# Patient Record
Sex: Male | Born: 1965 | Hispanic: Yes | Smoking: Never smoker
Health system: Southern US, Community
[De-identification: ages and names within clinical notes are randomized; demographics above are authoritative.]

---

## 2015-06-17 ENCOUNTER — Emergency Department (HOSPITAL_COMMUNITY)
Admission: EM | Admit: 2015-06-17 | Discharge: 2015-06-18 | Disposition: A | Discharge status: Home / Self Care | Payer: Self-pay | Attending: Emergency Medicine | Admitting: Emergency Medicine

## 2015-06-17 ENCOUNTER — Emergency Department (HOSPITAL_COMMUNITY): Payer: Self-pay

## 2015-06-17 ENCOUNTER — Encounter (HOSPITAL_COMMUNITY): Payer: Self-pay | Admitting: Emergency Medicine

## 2015-06-17 DIAGNOSIS — L03315 Cellulitis of perineum: Secondary | ICD-10-CM | POA: Insufficient documentation

## 2015-06-17 DIAGNOSIS — K610 Anal abscess: Secondary | ICD-10-CM | POA: Insufficient documentation

## 2015-06-17 DIAGNOSIS — N50812 Left testicular pain: Secondary | ICD-10-CM | POA: Insufficient documentation

## 2015-06-17 LAB — COMPREHENSIVE METABOLIC PANEL
ALBUMIN: 3.7 g/dL (ref 3.5–5.0)
ALK PHOS: 51 U/L (ref 38–126)
ALT: 19 U/L (ref 17–63)
ANION GAP: 9 (ref 5–15)
AST: 19 U/L (ref 15–41)
BUN: 12 mg/dL (ref 6–20)
CALCIUM: 9.4 mg/dL (ref 8.9–10.3)
CHLORIDE: 100 mmol/L — AB (ref 101–111)
CO2: 28 mmol/L (ref 22–32)
CREATININE: 0.75 mg/dL (ref 0.61–1.24)
GFR calc Af Amer: >60 mL/min (ref >60)
GFR calc non Af Amer: >60 mL/min (ref >60)
GLUCOSE: 106 mg/dL — AB (ref 65–99)
Potassium: 4.5 mmol/L (ref 3.5–5.1)
SODIUM: 137 mmol/L (ref 135–145)
Total Bilirubin: 0.7 mg/dL (ref 0.3–1.2)
Total Protein: 7.6 g/dL (ref 6.5–8.1)

## 2015-06-17 LAB — CBC WITH DIFFERENTIAL/PLATELET
BASOS PCT: 0 %
Basophils Absolute: 0 10*3/uL (ref 0.0–0.1)
EOS ABS: 0 10*3/uL (ref 0.0–0.7)
Eosinophils Relative: 0 %
HCT: 42.4 % (ref 39.0–52.0)
HEMOGLOBIN: 14.2 g/dL (ref 13.0–17.0)
Lymphocytes Relative: 8 %
Lymphs Abs: 1.3 10*3/uL (ref 0.7–4.0)
MCH: 32.9 pg (ref 26.0–34.0)
MCHC: 33.5 g/dL (ref 30.0–36.0)
MCV: 98.4 fL (ref 78.0–100.0)
Monocytes Absolute: 2 10*3/uL — ABNORMAL HIGH (ref 0.1–1.0)
Monocytes Relative: 12 %
NEUTROS PCT: 80 %
Neutro Abs: 12.5 10*3/uL — ABNORMAL HIGH (ref 1.7–7.7)
Platelets: 313 10*3/uL (ref 150–400)
RBC: 4.31 MIL/uL (ref 4.22–5.81)
RDW: 12.3 % (ref 11.5–15.5)
WBC: 15.8 10*3/uL — AB (ref 4.0–10.5)

## 2015-06-17 LAB — URINE MICROSCOPIC-ADD ON

## 2015-06-17 LAB — I-STAT CG4 LACTIC ACID, ED: Lactic Acid, Venous: 0.69 mmol/L (ref 0.5–2.0)

## 2015-06-17 LAB — URINALYSIS, ROUTINE W REFLEX MICROSCOPIC
Bilirubin Urine: NEGATIVE
Glucose, UA: NEGATIVE mg/dL
Ketones, ur: 15 mg/dL — AB
Leukocytes, UA: NEGATIVE
Nitrite: NEGATIVE
Protein, ur: NEGATIVE mg/dL
SPECIFIC GRAVITY, URINE: 1.044 — AB (ref 1.005–1.030)
UROBILINOGEN UA: 1 mg/dL (ref 0.0–1.0)
pH: 6 (ref 5.0–8.0)

## 2015-06-17 MED ORDER — LIDOCAINE HCL (PF) 1 % IJ SOLN
5.0000 mL | Freq: Once | INTRAMUSCULAR | Status: AC
  Administered 2015-06-17: 5 mL via INTRADERMAL
  Filled 2015-06-17: qty 5

## 2015-06-17 MED ORDER — IOHEXOL 300 MG/ML  SOLN
100.0000 mL | Freq: Once | INTRAMUSCULAR | Status: AC | PRN
  Administered 2015-06-17: 100 mL via INTRAVENOUS

## 2015-06-17 NOTE — ED Provider Notes (Signed)
CSN: [id number]     Arrival date & time 06/17/15  1736 History   First MD Initiated Contact with Patient 06/17/15 2125     Chief Complaint  Patient presents with  . Testicle Pain    The patient said he has a mass the size of an avocado pit on the left testicle.  He denies any injruy and said it just appeared four days ago.      (Consider location/radiation/quality/duration/timing/severity/associated sxs/prior Treatment) HPI Comments: Patient is a 49 year old male with no past medical history who presents with left testicle pain that started 3 days ago. Symptoms started gradually and progressively worsened since the onset. The pain is aching and severe and radiates to his left buttock. Palpation and bowel movements make the pain worse. No alleviating factors. No other associated symptoms.   Patient is a 49 y.o. male presenting with testicular pain. The history is provided by the patient. No language interpreter was used.  Testicle Pain The current episode started in the past 7 days. The problem occurs constantly. The problem has been unchanged. Pertinent negatives include no abdominal pain, chest pain, chills, coughing, fatigue, headaches, joint swelling, rash, sore throat or vomiting. Exacerbated by: palpation. He has tried nothing for the symptoms. The treatment provided no relief.    History reviewed. No pertinent past medical history. History reviewed. No pertinent past surgical history. History reviewed. No pertinent family history. Social History  Substance Use Topics  . Smoking status: Never Smoker   . Smokeless tobacco: Never Used  . Alcohol Use: Yes     Comment: occ    Review of Systems  Constitutional: Negative for chills and fatigue.  HENT: Negative for sore throat.   Respiratory: Negative for cough.   Cardiovascular: Negative for chest pain.  Gastrointestinal: Negative for vomiting and abdominal pain.  Genitourinary: Positive for testicular pain.  Musculoskeletal:  Negative for joint swelling.  Skin: Positive for wound. Negative for rash.  Neurological: Negative for headaches.  All other systems reviewed and are negative.     Allergies  Review of patient's allergies indicates no known allergies.  Home Medications   Prior to Admission medications   Not on File   BP 118/69 mmHg  Pulse 101  Temp(Src) 98 F (36.7 C) (Oral)  Resp 16  SpO2 97% Physical Exam  Constitutional: He is oriented to person, place, and time. He appears well-developed and well-nourished. No distress.  HENT:  Head: Normocephalic and atraumatic.  Eyes: Conjunctivae and EOM are normal.  Neck: Normal range of motion.  Cardiovascular: Regular rhythm.  Exam reveals no gallop and no friction rub.   No murmur heard. Tachycardic   Pulmonary/Chest: Effort normal and breath sounds normal. He has no wheezes. He has no rales. He exhibits no tenderness.  Abdominal: Soft. He exhibits no distension. There is no tenderness. There is no rebound.  Genitourinary: Penis normal. No penile tenderness.  Left perineal tenderness and swelling that extends to the left gluteus. No open wound or drainage. No scrotal tenderness or swelling.   Musculoskeletal: Normal range of motion.  Neurological: He is alert and oriented to person, place, and time. Coordination normal.  Speech is goal-oriented. Moves limbs without ataxia.   Skin: Skin is warm and dry.  Psychiatric: He has a normal mood and affect. His behavior is normal.  Nursing note and vitals reviewed.   ED Course  Procedures (including critical care time) Labs Review Labs Reviewed  CBC WITH DIFFERENTIAL/PLATELET - Abnormal; Notable for the following:  WBC 15.8 (*)    Neutro Abs 12.5 (*)    Monocytes Absolute 2.0 (*)    All other components within normal limits  COMPREHENSIVE METABOLIC PANEL - Abnormal; Notable for the following:    Chloride 100 (*)    Glucose, Bld 106 (*)    All other components within normal limits   URINALYSIS, ROUTINE W REFLEX MICROSCOPIC (NOT AT [facility]) - Abnormal; Notable for the following:    Color, Urine AMBER (*)    Specific Gravity, Urine 1.044 (*)    Hgb urine dipstick TRACE (*)    Ketones, ur 15 (*)    All other components within normal limits  I-STAT CG4 LACTIC ACID, ED - Abnormal; Notable for the following:    Lactic Acid, Venous 0.43 (*)    All other components within normal limits  URINE MICROSCOPIC-ADD ON  I-STAT CG4 LACTIC ACID, ED    Imaging Review Ct Pelvis W Contrast  06/17/2015  CLINICAL DATA:  Mass on the left testicle appeared 4 days ago. No injury. Left peroneal swelling. EXAM: CT PELVIS WITH CONTRAST TECHNIQUE: Multidetector CT imaging of the pelvis was performed using the standard protocol following the bolus administration of intravenous contrast. CONTRAST:  OMNIPAQUE IOHEXOL 300 MG/ML  SOLN COMPARISON:  None. FINDINGS: There is infiltration in the subcutaneous perineal fat extending inferior to the anus from no soft tissue gas collections identified. Calcification of the prostate gland without significant enlargement. Bladder wall is not thickened. Rectosigmoid colon appears intact without evidence of diverticulitis. Appendix is normal. No pelvic mass or lymphadenopathy. No destructive bone lesions. Left medial perianal region. There is associated circumscribed fluid collection with enhancing wall in the left peroneal region measuring 2.3 x 3.8 cm. This is consistent with perianal abscess and cellulitis. IMPRESSION: Left inferior perianal abscess and cellulitis. Electronically Signed   By: Burman Nieves M.D.   On: 06/17/2015 22:36   I have personally reviewed and evaluated these images and lab results as part of my medical decision-making.   EKG Interpretation None      MDM   Final diagnoses:  Perianal abscess  Cellulitis of perineum    10:36 PM Labs show elevated WBC at 15.8. Patient will have CT pelvis to rule out abscess or deep infection.    Patient with perianal abscess and cellulitis seen on CT. I spoke with Dr. Magnus Ivan about the patient who saw the patient in the ED. He offered surgery in the morning but patient declined and asked for drainage in the ED which Dr. Magnus Ivan did. He will be discharged with antibiotics and pain medication. Patient will follow up with Dr. Magnus Ivan in the clinic.   Emilia Beck, PA-C 06/18/15 0344  Linwood Dibbles, MD 06/19/15 2204

## 2015-06-17 NOTE — ED Notes (Signed)
The patient said he has a mass the size of an avocado pit on the left testicle.  He denies any injruy and said it just appeared four days ago.  The patient rates his pain 8/10.  He denies any problems urinating or defecating.  He said the pain got worse so he decided to come in.

## 2015-06-18 LAB — I-STAT CG4 LACTIC ACID, ED: LACTIC ACID, VENOUS: 0.43 mmol/L — AB (ref 0.5–2.0)

## 2015-06-18 MED ORDER — CEPHALEXIN 500 MG PO CAPS: 500.0000 mg | ORAL_CAPSULE | Freq: Four times a day (QID) | ORAL | Status: AC

## 2015-06-18 MED ORDER — OXYCODONE-ACETAMINOPHEN 5-325 MG PO TABS: 2.0000 | ORAL_TABLET | ORAL | Status: AC | PRN

## 2015-06-18 NOTE — Consult Note (Signed)
Reason for Consult: Perianal abscess Referring Physician: Dr. J. Knapp  Marvin Anderson is an 49 y.o. male.  HPI: This is an otherwise healthy individual who presents with a several-day history of pain in his perineum. He was describing it as testicular pain. He presented was found to have an elevated white blood count. A CT scan showed a 2 cm x 3.8 cm perianal abscess so surgery was asked to see the patient. He has no previous history of abscess and is otherwise without complaints.  History reviewed. No pertinent past medical history.  History reviewed. No pertinent past surgical history.  History reviewed. No pertinent family history.  Social History:  reports that he has never smoked. He has never used smokeless tobacco. He reports that he drinks alcohol. He reports that he does not use illicit drugs.  Allergies: No Known Allergies  Medications: I have reviewed the patient's current medications.  Results for orders placed or performed during the hospital encounter of 06/17/15 (from the past 48 hour(s))  CBC with Differential     Status: Abnormal   Collection Time: 06/17/15  6:39 PM  Result Value Ref Range   WBC 15.8 (H) 4.0 - 10.5 K/uL   RBC 4.31 4.22 - 5.81 MIL/uL   Hemoglobin 14.2 13.0 - 17.0 g/dL   HCT 42.4 39.0 - 52.0 %   MCV 98.4 78.0 - 100.0 fL   MCH 32.9 26.0 - 34.0 pg   MCHC 33.5 30.0 - 36.0 g/dL   RDW 12.3 11.5 - 15.5 %   Platelets 313 150 - 400 K/uL   Neutrophils Relative % 80 %   Neutro Abs 12.5 (H) 1.7 - 7.7 K/uL   Lymphocytes Relative 8 %   Lymphs Abs 1.3 0.7 - 4.0 K/uL   Monocytes Relative 12 %   Monocytes Absolute 2.0 (H) 0.1 - 1.0 K/uL   Eosinophils Relative 0 %   Eosinophils Absolute 0.0 0.0 - 0.7 K/uL   Basophils Relative 0 %   Basophils Absolute 0.0 0.0 - 0.1 K/uL  Comprehensive metabolic panel     Status: Abnormal   Collection Time: 06/17/15  6:39 PM  Result Value Ref Range   Sodium 137 135 - 145 mmol/L   Potassium 4.5 3.5 - 5.1 mmol/L   Chloride  100 (L) 101 - 111 mmol/L   CO2 28 22 - 32 mmol/L   Glucose, Bld 106 (H) 65 - 99 mg/dL   BUN 12 6 - 20 mg/dL   Creatinine, Ser 0.75 0.61 - 1.24 mg/dL   Calcium 9.4 8.9 - 10.3 mg/dL   Total Protein 7.6 6.5 - 8.1 g/dL   Albumin 3.7 3.5 - 5.0 g/dL   AST 19 15 - 41 U/L   ALT 19 17 - 63 U/L   Alkaline Phosphatase 51 38 - 126 U/L   Total Bilirubin 0.7 0.3 - 1.2 mg/dL   GFR calc non Af Amer >60 >60 mL/min   GFR calc Af Amer >60 >60 mL/min    Comment: (NOTE) The eGFR has been calculated using the CKD EPI equation. This calculation has not been validated in all clinical situations. eGFR's persistently <60 mL/min signify possible Chronic Kidney Disease.    Anion gap 9 5 - 15  I-Stat CG4 Lactic Acid, ED     Status: None   Collection Time: 06/17/15  9:51 PM  Result Value Ref Range   Lactic Acid, Venous 0.69 0.5 - 2.0 mmol/L  Urinalysis, Routine w reflex microscopic (not at [facility])       Status: Abnormal   Collection Time: 06/17/15 10:49 PM  Result Value Ref Range   Color, Urine AMBER (A) YELLOW    Comment: BIOCHEMICALS MAY BE AFFECTED BY COLOR   APPearance CLEAR CLEAR   Specific Gravity, Urine 1.044 (H) 1.005 - 1.030   pH 6.0 5.0 - 8.0   Glucose, UA NEGATIVE NEGATIVE mg/dL   Hgb urine dipstick TRACE (A) NEGATIVE   Bilirubin Urine NEGATIVE NEGATIVE   Ketones, ur 15 (A) NEGATIVE mg/dL   Protein, ur NEGATIVE NEGATIVE mg/dL   Urobilinogen, UA 1.0 0.0 - 1.0 mg/dL   Nitrite NEGATIVE NEGATIVE   Leukocytes, UA NEGATIVE NEGATIVE  Urine microscopic-add on     Status: None   Collection Time: 06/17/15 10:49 PM  Result Value Ref Range   WBC, UA 0-2 <3 WBC/hpf   RBC / HPF 3-6 <3 RBC/hpf   Bacteria, UA RARE RARE   Urine-Other MUCOUS PRESENT     Ct Pelvis W Contrast  06/17/2015   CLINICAL DATA:  Mass on the left testicle appeared 4 days ago. No injury. Left peroneal swelling.  EXAM: CT PELVIS WITH CONTRAST  TECHNIQUE: Multidetector CT imaging of the pelvis was performed using the standard protocol  following the bolus administration of intravenous contrast.  CONTRAST:  100mL OMNIPAQUE IOHEXOL 300 MG/ML  SOLN  COMPARISON:  None.  FINDINGS: There is infiltration in the subcutaneous perineal fat extending inferior to the anus from no soft tissue gas collections identified.  Calcification of the prostate gland without significant enlargement. Bladder wall is not thickened. Rectosigmoid colon appears intact without evidence of diverticulitis. Appendix is normal. No pelvic mass or lymphadenopathy. No destructive bone lesions. Left medial perianal region. There is associated circumscribed fluid collection with enhancing wall in the left peroneal region measuring 2.3 x 3.8 cm. This is consistent with perianal abscess and cellulitis.  IMPRESSION: Left inferior perianal abscess and cellulitis.   Electronically Signed   By: William  Stevens M.D.   On: 06/17/2015 22:36    Review of Systems  All other systems reviewed and are negative.  Blood pressure 118/72, pulse 86, temperature 98 F (36.7 C), temperature source Oral, resp. rate 16, SpO2 100 %. Physical Exam  Constitutional: He appears well-developed and well-nourished. No distress.  Cardiovascular: Normal rate and regular rhythm.   Respiratory: Effort normal and breath sounds normal. No respiratory distress.  Genitourinary:  There is a firmness along his left anterior perianal area near the perineum with minimal erythema but moderate tenderness    Assessment/Plan: Perianal abscess  I discussed this in detail with the patient. I discussed performing a bedside incision and drainage versus admission to the hospital for incision and drainage in the operating room in the morning. I'm discussing it, the patient requested that I do this at the bedside.  I prepped his left perianal area with Betadine. I anesthetized the skin with 1% lidocaine. I made an incision with a scalpel and entered the abscess cavity which I then drained of all the purulence. I then  irrigated with saline and packed it with a dry gauze. Gauze and tape were then placed over the packing. He tolerated this well.  He'll be discharged on oral antibiotics and pain medication. He will be given the number to our office and I told him to call this in the morning so follow-up can be arranged for wound check. Wound care instructions were also given.  BLACKMAN,DOUGLAS A 06/18/2015, 12:10 AM      

## 2015-06-18 NOTE — Discharge Instructions (Signed)
Get in the shower in the morning and get the bandage soaking wet and then remove it totally and washed the wound with soap and water.  Repeat pack the abscess site with the edge of a gauze after your shower. Do this 1-2 times a day  Call our office in the morning to set up a follow-up visit

## 2016-06-21 IMAGING — CT CT PELVIS W/ CM
2 of 3 series · 14 of 46 positions shown, 16 images · IV contrast (Iodine)
Comparison: None.

CLINICAL DATA: Mass on the left testicle appeared 4 days ago. No
injury. Left peroneal swelling.

EXAM:
CT PELVIS WITH CONTRAST
TECHNIQUE: Multidetector CT imaging of the pelvis was performed using the
standard protocol following the bolus administration of intravenous
contrast.
CONTRAST:  100mL OMNIPAQUE IOHEXOL 300 MG/ML  SOLN

[Series 201: routine, idose (2) · axial · 0.69mm/px · z∈[+28,+323]mm · 11 of 69 slices shown, 13 images]
[im 5/69  soft-tissue]
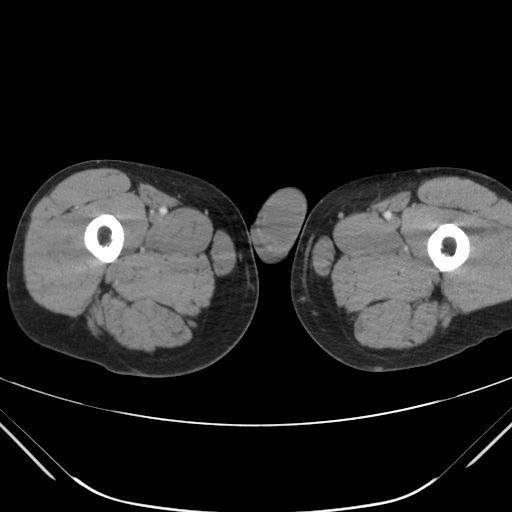
[im 5/69  bone]
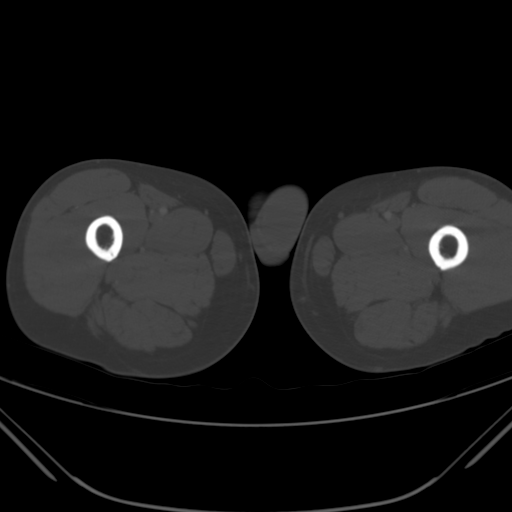
[im 11/69  soft-tissue]
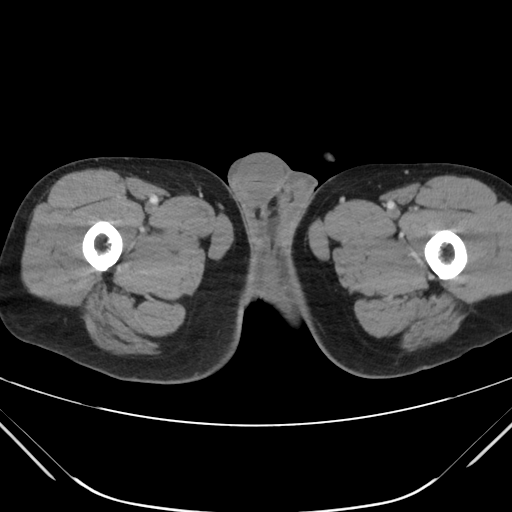
[im 16/69  soft-tissue]
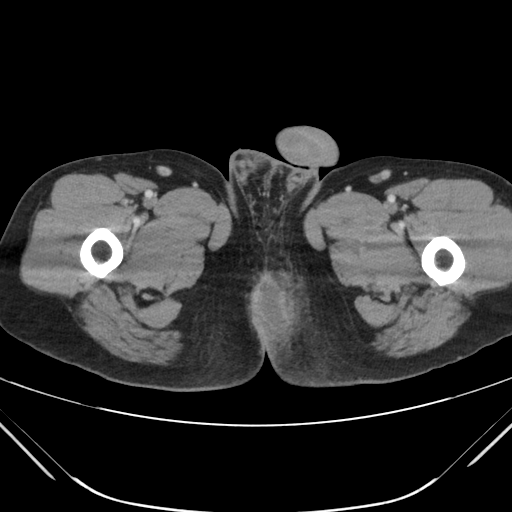
[im 22/69  soft-tissue]
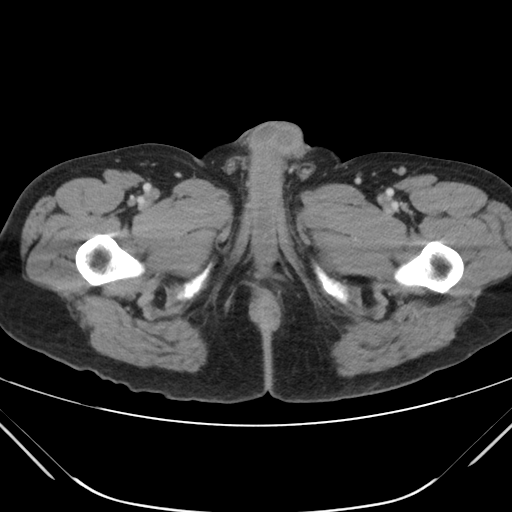
[im 29/69  soft-tissue]
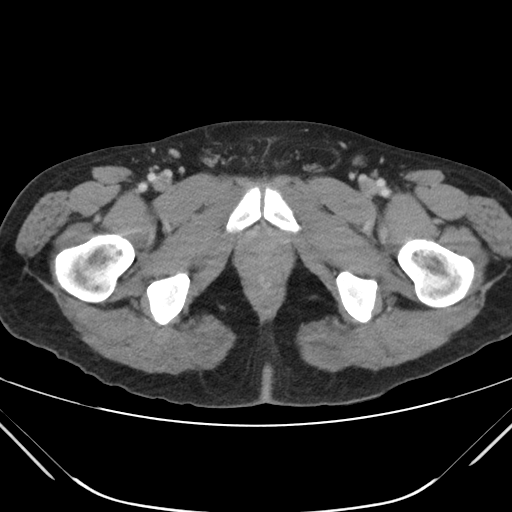
[im 36/69  soft-tissue]
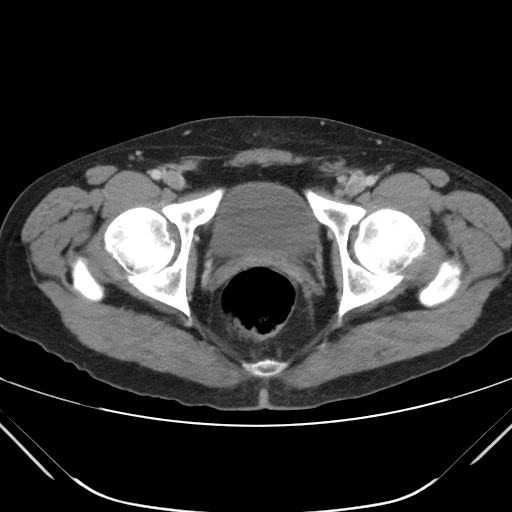
[im 40/69  soft-tissue]
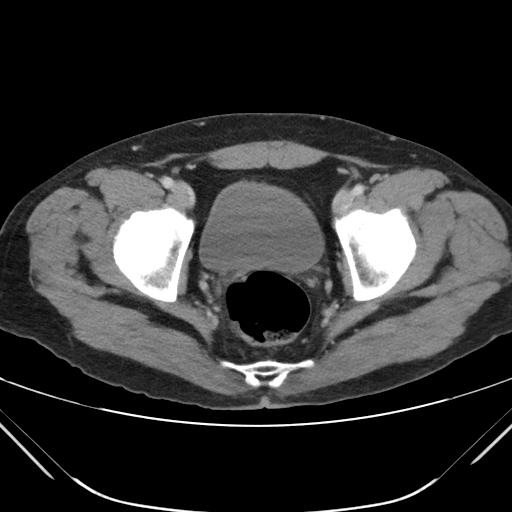
[im 47/69  soft-tissue]
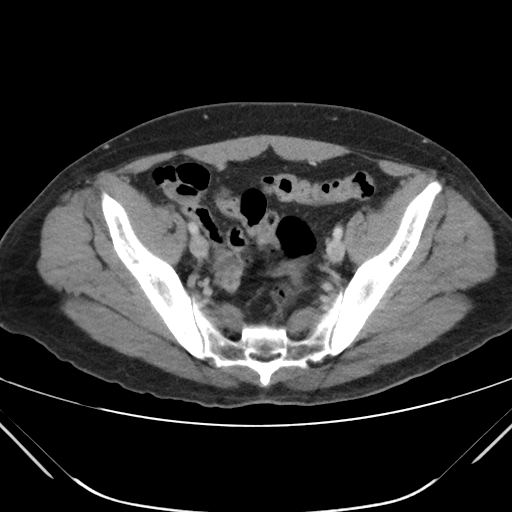
[im 53/69  soft-tissue]
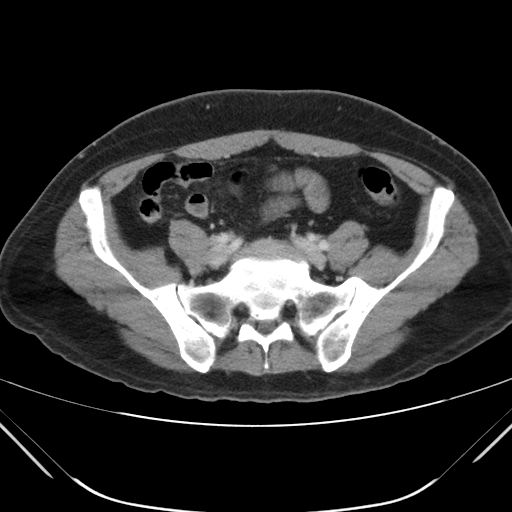
[im 53/69  bone]
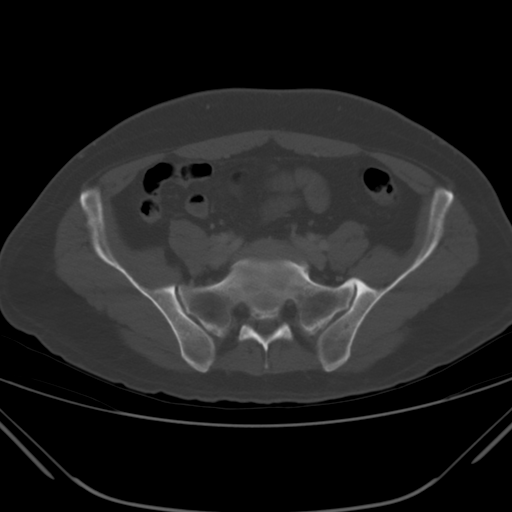
[im 58/69  soft-tissue]
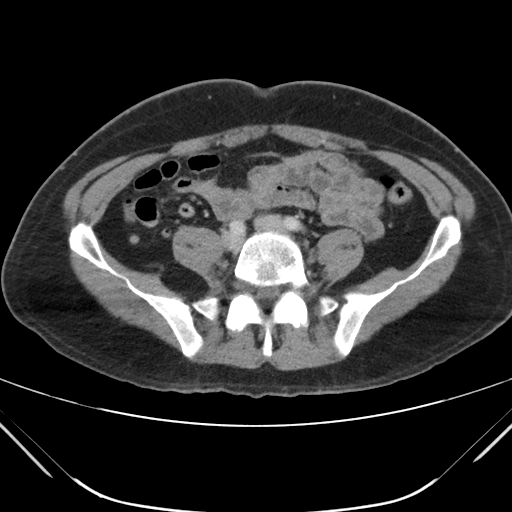
[im 64/69  soft-tissue]
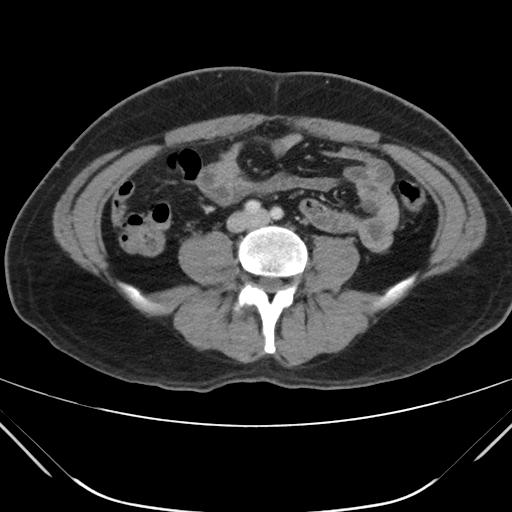

[Series 203: coronals, idose (2) · coronal · 0.45mm/px · 3 of 108 slices shown]
[im 36/108  soft-tissue]
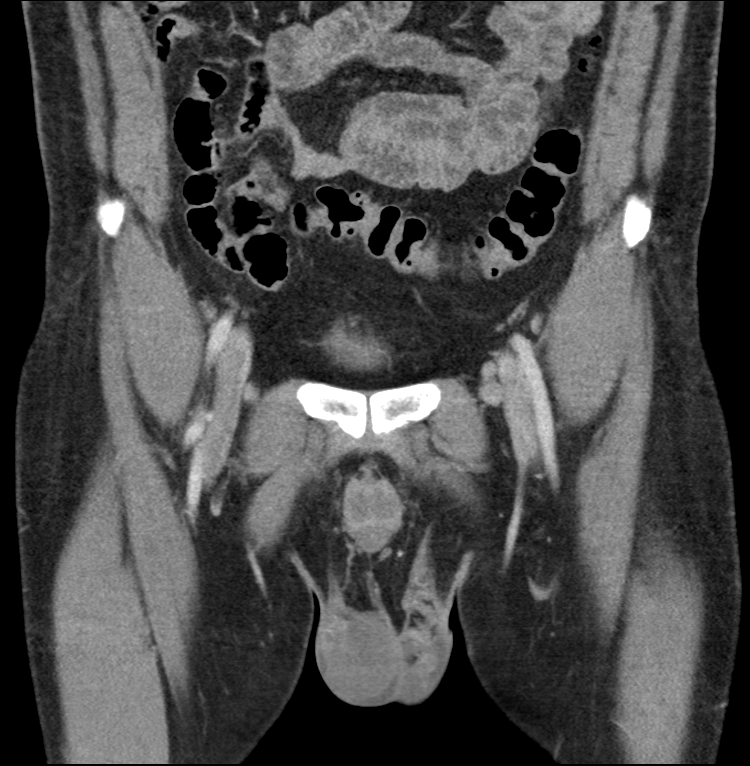
[im 48/108  soft-tissue]
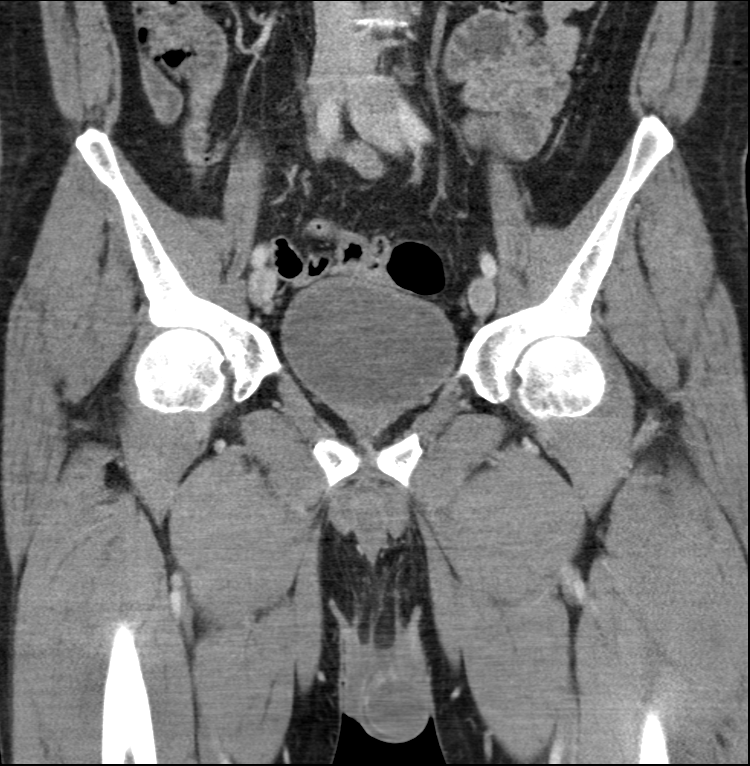
[im 60/108  soft-tissue]
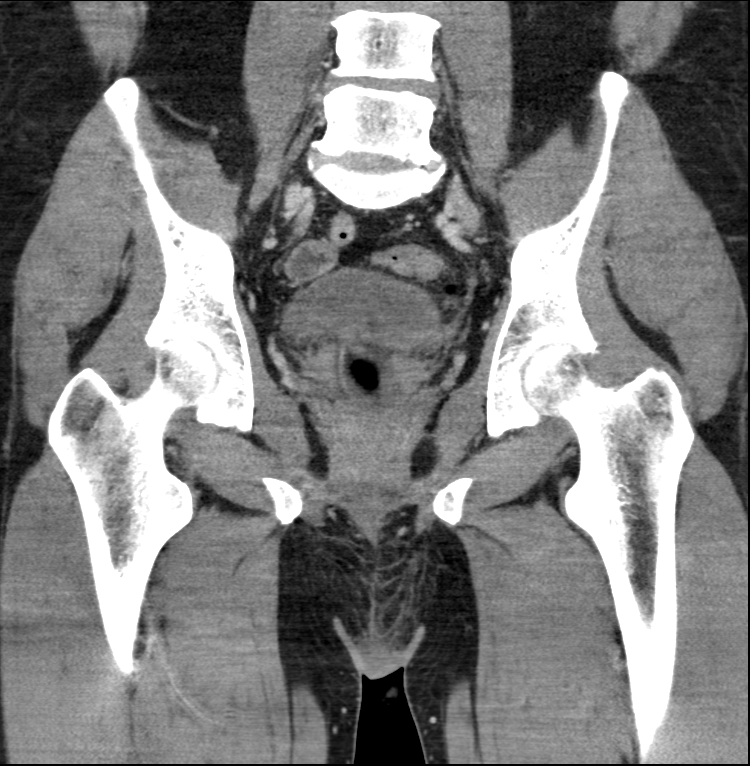

[14 of 46 positions shown; findings below may reference images not displayed]

FINDINGS: There is infiltration in the subcutaneous perineal fat extending
inferior to the anus from no soft tissue gas collections identified.

Calcification of the prostate gland without significant enlargement.
Bladder wall is not thickened. Rectosigmoid colon appears intact
without evidence of diverticulitis. Appendix is normal. No pelvic
mass or lymphadenopathy. No destructive bone lesions. Left medial
perianal region. There is associated circumscribed fluid collection
with enhancing wall in the left peroneal region measuring 2.3 x
cm. This is consistent with perianal abscess and cellulitis.
IMPRESSION: Left inferior perianal abscess and cellulitis.

## 2019-02-01 ENCOUNTER — Encounter (HOSPITAL_COMMUNITY): Payer: Self-pay | Admitting: Emergency Medicine

## 2019-02-01 ENCOUNTER — Other Ambulatory Visit: Payer: Self-pay

## 2019-02-01 ENCOUNTER — Emergency Department (HOSPITAL_COMMUNITY)
Admission: EM | Admit: 2019-02-01 | Discharge: 2019-02-02 | Disposition: A | Discharge status: Home / Self Care | Payer: Self-pay | Attending: Emergency Medicine | Admitting: Emergency Medicine

## 2019-02-01 DIAGNOSIS — K610 Anal abscess: Secondary | ICD-10-CM | POA: Insufficient documentation

## 2019-02-01 NOTE — ED Notes (Signed)
PA Muthersbaugh made aware of patient, will place further orders as appropriate.

## 2019-02-01 NOTE — ED Triage Notes (Addendum)
Patient reports pain in his groin x2-3 days, denies swelling or redness. No known injuries. Denies difficulty urinating or discharge. Denies fevers or chills.

## 2019-02-02 ENCOUNTER — Encounter (HOSPITAL_COMMUNITY): Payer: Self-pay | Admitting: Radiology

## 2019-02-02 ENCOUNTER — Other Ambulatory Visit: Payer: Self-pay

## 2019-02-02 ENCOUNTER — Emergency Department (HOSPITAL_COMMUNITY): Payer: Self-pay

## 2019-02-02 LAB — COMPREHENSIVE METABOLIC PANEL
ALT: 27 U/L (ref 0–44)
AST: 30 U/L (ref 15–41)
Albumin: 4 g/dL (ref 3.5–5.0)
Alkaline Phosphatase: 58 U/L (ref 38–126)
Anion gap: 10 (ref 5–15)
BUN: 12 mg/dL (ref 6–20)
CO2: 22 mmol/L (ref 22–32)
Calcium: 8.9 mg/dL (ref 8.9–10.3)
Chloride: 106 mmol/L (ref 98–111)
Creatinine, Ser: 0.7 mg/dL (ref 0.61–1.24)
GFR calc Af Amer: >60 mL/min (ref >60)
GFR calc non Af Amer: >60 mL/min (ref >60)
Glucose, Bld: 109 mg/dL — ABNORMAL HIGH (ref 70–99)
Potassium: 3.8 mmol/L (ref 3.5–5.1)
Sodium: 138 mmol/L (ref 135–145)
Total Bilirubin: 0.7 mg/dL (ref 0.3–1.2)
Total Protein: 7.2 g/dL (ref 6.5–8.1)

## 2019-02-02 LAB — CBC WITH DIFFERENTIAL/PLATELET
Abs Immature Granulocytes: 0.04 10*3/uL (ref 0.00–0.07)
Basophils Absolute: 0.1 10*3/uL (ref 0.0–0.1)
Basophils Relative: 1 %
Eosinophils Absolute: 0.2 10*3/uL (ref 0.0–0.5)
Eosinophils Relative: 1 %
HCT: 41.3 % (ref 39.0–52.0)
Hemoglobin: 13.9 g/dL (ref 13.0–17.0)
Immature Granulocytes: 0 %
Lymphocytes Relative: 23 %
Lymphs Abs: 2.6 10*3/uL (ref 0.7–4.0)
MCH: 33 pg (ref 26.0–34.0)
MCHC: 33.7 g/dL (ref 30.0–36.0)
MCV: 98.1 fL (ref 80.0–100.0)
Monocytes Absolute: 1.3 10*3/uL — ABNORMAL HIGH (ref 0.1–1.0)
Monocytes Relative: 12 %
Neutro Abs: 7.3 10*3/uL (ref 1.7–7.7)
Neutrophils Relative %: 63 %
Platelets: 338 10*3/uL (ref 150–400)
RBC: 4.21 MIL/uL — ABNORMAL LOW (ref 4.22–5.81)
RDW: 12.2 % (ref 11.5–15.5)
WBC: 11.5 10*3/uL — ABNORMAL HIGH (ref 4.0–10.5)
nRBC: 0 % (ref 0.0–0.2)

## 2019-02-02 LAB — URINALYSIS, ROUTINE W REFLEX MICROSCOPIC
Bilirubin Urine: NEGATIVE
Glucose, UA: NEGATIVE mg/dL
Hgb urine dipstick: NEGATIVE
Ketones, ur: NEGATIVE mg/dL
Leukocytes,Ua: NEGATIVE
Nitrite: NEGATIVE
Protein, ur: NEGATIVE mg/dL
Specific Gravity, Urine: 1.024 (ref 1.005–1.030)
pH: 5 (ref 5.0–8.0)

## 2019-02-02 MED ORDER — LIDOCAINE-EPINEPHRINE (PF) 2 %-1:200000 IJ SOLN
10.0000 mL | Freq: Once | INTRAMUSCULAR | Status: AC
  Administered 2019-02-02: 10 mL via INTRADERMAL
  Filled 2019-02-02: qty 20

## 2019-02-02 MED ORDER — AMOXICILLIN-POT CLAVULANATE 875-125 MG PO TABS: 1.0000 | ORAL_TABLET | Freq: Two times a day (BID) | ORAL | 0 refills | Status: AC

## 2019-02-02 MED ORDER — AMOXICILLIN-POT CLAVULANATE 875-125 MG PO TABS
1.0000 | ORAL_TABLET | Freq: Once | ORAL | Status: AC
  Administered 2019-02-02: 1 via ORAL
  Filled 2019-02-02: qty 1

## 2019-02-02 MED ORDER — IOHEXOL 300 MG/ML  SOLN
100.0000 mL | Freq: Once | INTRAMUSCULAR | Status: AC | PRN
  Administered 2019-02-02: 100 mL via INTRAVENOUS

## 2019-02-02 NOTE — Discharge Instructions (Addendum)
You may alternate Tylenol 1000 mg every 6 hours as needed for pain and Ibuprofen 800 mg every 8 hours as needed for pain.  Please take Ibuprofen with food. ° °

## 2019-02-02 NOTE — ED Notes (Signed)
Patient verbalizes understanding of discharge instructions. Opportunity for questioning and answers were provided. Armband removed by staff, pt discharged from ED ambulatory.   

## 2019-02-02 NOTE — ED Provider Notes (Signed)
TIME SEEN: 2:16 AM  CHIEF COMPLAINT: Rectal pain  HPI: Patient is a 53 year old male with no significant past medical history who presents to the emergency department with 2 days of perineal pain and swelling.  No known fevers, chills.  No testicular pain or swelling.  No dysuria or hematuria.  No penile discharge.  No bloody stools or melena.  Has never had a rectal abscess before.  Not a diabetic.  No history of Crohn's or ulcerative colitis.  ROS: See HPI Constitutional: no fever  Eyes: no drainage  ENT: no runny nose   Cardiovascular:  no chest pain  Resp: no SOB  GI: no vomiting GU: no dysuria Integumentary: no rash  Allergy: no hives  Musculoskeletal: no leg swelling  Neurological: no slurred speech ROS otherwise negative  PAST MEDICAL HISTORY/PAST SURGICAL HISTORY:  History reviewed. No pertinent past medical history.  MEDICATIONS:  Prior to Admission medications   Medication Sig Start Date End Date Taking? Authorizing Provider  cephALEXin (KEFLEX) 500 MG capsule Take 1 capsule (500 mg total) by mouth 4 (four) times daily. 06/18/15   Emilia Beck, PA-C  oxyCODONE-acetaminophen (PERCOCET/ROXICET) 5-325 MG tablet Take 2 tablets by mouth every 4 (four) hours as needed for severe pain. 06/18/15   Emilia Beck, PA-C    ALLERGIES:  No Known Allergies  SOCIAL HISTORY:  Social History   Tobacco Use  . Smoking status: Never Smoker  . Smokeless tobacco: Never Used  Substance Use Topics  . Alcohol use: Yes    Comment: occ    FAMILY HISTORY: No family history on file.  EXAM: BP (!) 145/85 (BP Location: Right Arm)   Pulse 85   Temp 97.9 F (36.6 C) (Oral)   Resp 16   SpO2 100%  CONSTITUTIONAL: Alert and oriented and responds appropriately to questions. Well-appearing; well-nourished HEAD: Normocephalic EYES: Conjunctivae clear, pupils appear equal, EOMI ENT: normal nose; moist mucous membranes NECK: Supple, no meningismus, no nuchal rigidity, no LAD   CARD: RRR; S1 and S2 appreciated; no murmurs, no clicks, no rubs, no gallops RESP: Normal chest excursion without splinting or tachypnea; breath sounds clear and equal bilaterally; no wheezes, no rhonchi, no rales, no hypoxia or respiratory distress, speaking full sentences ABD/GI: Normal bowel sounds; non-distended; soft, non-tender, no rebound, no guarding, no peritoneal signs, no hepatosplenomegaly GU:  Normal external genitalia, circumcised male, normal penile shaft, no blood or discharge at the urethral meatus, no testicular masses or tenderness on exam, no scrotal masses or swelling, no hernias appreciated, 2+ femoral pulses bilaterally; he has perineal tenderness with warmth and erythema with an indurated area without fluctuance or drainage, subcutaneous air or crepitus; no high riding testicle, normal bilateral cremasteric reflex.  Chaperone present for exam. BACK:  The back appears normal and is non-tender to palpation, there is no CVA tenderness EXT: Normal ROM in all joints; non-tender to palpation; no edema; normal capillary refill; no cyanosis, no calf tenderness or swelling    SKIN: Normal color for age and race; warm; no rash NEURO: Moves all extremities equally PSYCH: The patient's mood and manner are appropriate. Grooming and personal hygiene are appropriate.   MEDICAL DECISION MAKING: Patient here with rectal pain.  It seems to track to the rectum and he is quite tender throughout the perineum with an indurated, erythematous, warm area.  There is no crepitus, ecchymosis or signs of gangrene.  I do not think this is Fournier's gangrene and he does not have any signs of necrotizing fasciitis currently.  He  is hemodynamically stable and nonseptic appearing.  His labs show mild leukocytosis but otherwise unremarkable.  The rest of his genital exam is normal.  His urine does not appear infected.  Will obtain a CT of the pelvis with IV contrast to further evaluate what I suspect is an  abscess and to see where this abscess tracks and how large it is.  I can access it from the perineum and I think that he would be amendable to drainage in the ED after the CT scan.  He is comfortable with this plan.  He declines pain medicine at this time.  ED PROGRESS: CT scan shows a 10 mm x 38 mm perianal abscess with probable fistula.  Will perform I&D in the emergency department and send wound culture and discharge home on antibiotics with outpatient follow-up with general surgery if abscess recurs.  Incision and drainage of abscess performed by Haywood PaoSherry Estill, PA.  Please see her note.  Wound culture is pending.  Will discharge on Augmentin and give surgery follow-up information if abscess recurs.  Recommended alternating Tylenol and Motrin for pain.  Recommended warm soaks and that he remove packing in 72 hours.   At this time, I do not feel there is any life-threatening condition present. I have reviewed and discussed all results (EKG, imaging, lab, urine as appropriate) and exam findings with patient/family. I have reviewed nursing notes and appropriate previous records.  I feel the patient is safe to be discharged home without further emergent workup and can continue workup as an outpatient as needed. Discussed usual and customary return precautions. Patient/family verbalize understanding and are comfortable with this plan.  Outpatient follow-up has been provided as needed. All questions have been answered.    Arna Luis, Layla MawKristen N, DO 02/02/19 [phone]

## 2019-02-02 NOTE — ED Provider Notes (Signed)
INCISION AND DRAINAGE Performed by: Arnoldo Hooker Consent: Verbal consent obtained. Risks and benefits: risks, benefits and alternatives were discussed Type: abscess  Body area: right perianal area  Anesthesia: local infiltration  Incision was made with an 11-blade scalpel.  Local anesthetic: lidocaine 2% w/epinephrine  Anesthetic total: 2 ml  Complexity: complex  - scarring present from previous I&D Blunt dissection to break up loculations  Drainage: purulent, bloody  Drainage amount: moderate  Packing material: 1/4 in iodoform gauze  Patient tolerance: Patient tolerated the procedure well with no immediate complications.      Elpidio Anis, PA-C 02/02/19 2549    Ward, Layla Maw, DO 02/02/19 [phone]

## 2019-02-04 LAB — AEROBIC CULTURE W GRAM STAIN (SUPERFICIAL SPECIMEN): Gram Stain: NONE SEEN

## 2019-02-04 LAB — AEROBIC CULTURE? (SUPERFICIAL SPECIMEN)

## 2019-02-05 ENCOUNTER — Telehealth: Payer: Self-pay

## 2019-02-05 NOTE — Telephone Encounter (Signed)
Post ED Visit - Positive Culture Follow-up  Culture report reviewed by antimicrobial stewardship pharmacist: [facility] []  Enzo Bi, Pharm.D. []  Celedonio Miyamoto, Pharm.D., BCPS AQ-ID []  Garvin Fila, Pharm.D., BCPS []  Georgina Pillion, [address].D., BCPS []  [place], [address].D., BCPS, AAHIVP []  Estella Husk, Pharm.D., BCPS, AAHIVP []  Lysle Pearl, PharmD, BCPS []  Phillips Climes, PharmD, BCPS []  Agapito Games, PharmD, BCPS []  Verlan Friends, PharmD []  Mervyn Gay, PharmD, BCPS []  Vinnie Level, PharmD  [facility] []  Len Childs, PharmD []  Greer Pickerel, PharmD []  Adalberto Cole, PharmD []  Perlie Gold, Rph []  Lonell Face) Jean Rosenthal, PharmD []  Earl Many, PharmD []  Junita Push, PharmD []  Dorna Leitz, PharmD []  Terrilee Files, PharmD []  Lynann Beaver, PharmD []  Keturah Barre, PharmD []  Loralee Pacas, PharmD []  Bernadene Person, PharmD   Positive aerobic culture Treated with amoxicillin, organism sensitive to the same and no further patient follow-up is required at this time.  [name] 02/05/2019, 9:12 AM
